# Patient Record
Sex: Female | Born: 1960
Health system: Southern US, Community
[De-identification: ages and names within clinical notes are randomized; demographics above are authoritative.]

---

## 2001-03-26 ENCOUNTER — Other Ambulatory Visit: Admission: RE | Admit: 2001-03-26 | Discharge: 2001-03-26 | Payer: Self-pay | Admitting: Obstetrics and Gynecology

## 2002-07-08 ENCOUNTER — Encounter: Payer: Self-pay | Admitting: Obstetrics and Gynecology

## 2002-07-08 ENCOUNTER — Ambulatory Visit (HOSPITAL_COMMUNITY): Admission: RE | Admit: 2002-07-08 | Discharge: 2002-07-08 | Payer: Self-pay | Admitting: Obstetrics and Gynecology

## 2008-02-27 ENCOUNTER — Other Ambulatory Visit: Admission: RE | Admit: 2008-02-27 | Discharge: 2008-02-27 | Payer: Self-pay | Admitting: Obstetrics and Gynecology

## 2008-03-09 ENCOUNTER — Ambulatory Visit (HOSPITAL_COMMUNITY): Admission: RE | Admit: 2008-03-09 | Discharge: 2008-03-09 | Payer: Self-pay | Admitting: Obstetrics & Gynecology

## 2008-09-10 ENCOUNTER — Ambulatory Visit (HOSPITAL_COMMUNITY): Admission: RE | Admit: 2008-09-10 | Discharge: 2008-09-10 | Payer: Self-pay | Admitting: Obstetrics and Gynecology

## 2009-03-11 ENCOUNTER — Ambulatory Visit (HOSPITAL_COMMUNITY): Admission: RE | Admit: 2009-03-11 | Discharge: 2009-03-11 | Payer: Self-pay | Admitting: Obstetrics and Gynecology

## 2009-03-25 ENCOUNTER — Other Ambulatory Visit: Admission: RE | Admit: 2009-03-25 | Discharge: 2009-03-25 | Payer: Self-pay | Admitting: Obstetrics and Gynecology

## 2010-05-13 ENCOUNTER — Other Ambulatory Visit: Admission: RE | Admit: 2010-05-13 | Discharge: 2010-05-13 | Payer: Self-pay | Admitting: Obstetrics and Gynecology

## 2010-05-25 ENCOUNTER — Ambulatory Visit (HOSPITAL_COMMUNITY): Admission: RE | Admit: 2010-05-25 | Discharge: 2010-05-25 | Payer: Self-pay | Admitting: Obstetrics & Gynecology

## 2011-05-18 ENCOUNTER — Other Ambulatory Visit: Payer: Self-pay | Admitting: Adult Health

## 2011-05-18 ENCOUNTER — Other Ambulatory Visit (HOSPITAL_COMMUNITY)
Admission: RE | Admit: 2011-05-18 | Discharge: 2011-05-18 | Disposition: A | Discharge status: Home / Self Care | Payer: Managed Care, Other (non HMO) | Source: Ambulatory Visit | Attending: Obstetrics and Gynecology | Admitting: Obstetrics and Gynecology

## 2011-05-18 DIAGNOSIS — Z01419 Encounter for gynecological examination (general) (routine) without abnormal findings: Secondary | ICD-10-CM | POA: Insufficient documentation

## 2011-05-25 ENCOUNTER — Other Ambulatory Visit: Payer: Self-pay | Admitting: Adult Health

## 2011-05-25 DIAGNOSIS — Z139 Encounter for screening, unspecified: Secondary | ICD-10-CM

## 2011-05-30 ENCOUNTER — Ambulatory Visit (HOSPITAL_COMMUNITY)
Admission: RE | Admit: 2011-05-30 | Discharge: 2011-05-30 | Disposition: A | Discharge status: Home / Self Care | Payer: Managed Care, Other (non HMO) | Source: Ambulatory Visit | Attending: Adult Health | Admitting: Adult Health

## 2011-05-30 DIAGNOSIS — Z1231 Encounter for screening mammogram for malignant neoplasm of breast: Secondary | ICD-10-CM | POA: Insufficient documentation

## 2011-05-30 DIAGNOSIS — Z139 Encounter for screening, unspecified: Secondary | ICD-10-CM

## 2012-06-20 ENCOUNTER — Other Ambulatory Visit (HOSPITAL_COMMUNITY)
Admission: RE | Admit: 2012-06-20 | Discharge: 2012-06-20 | Disposition: A | Discharge status: Home / Self Care | Payer: Managed Care, Other (non HMO) | Source: Ambulatory Visit | Attending: Obstetrics and Gynecology | Admitting: Obstetrics and Gynecology

## 2012-06-20 ENCOUNTER — Other Ambulatory Visit: Payer: Self-pay | Admitting: Adult Health

## 2012-06-20 DIAGNOSIS — Z1151 Encounter for screening for human papillomavirus (HPV): Secondary | ICD-10-CM | POA: Insufficient documentation

## 2012-06-20 DIAGNOSIS — Z01419 Encounter for gynecological examination (general) (routine) without abnormal findings: Secondary | ICD-10-CM | POA: Insufficient documentation

## 2013-01-16 ENCOUNTER — Other Ambulatory Visit: Payer: Self-pay | Admitting: Adult Health

## 2013-01-16 MED ORDER — HYDROCHLOROTHIAZIDE 25 MG PO TABS: 25.0000 mg | ORAL_TABLET | Freq: Every day | ORAL | Status: DC

## 2013-04-06 ENCOUNTER — Other Ambulatory Visit: Payer: Self-pay | Admitting: Adult Health

## 2013-12-11 ENCOUNTER — Other Ambulatory Visit: Payer: Self-pay | Admitting: Adult Health

## 2013-12-17 ENCOUNTER — Other Ambulatory Visit: Payer: Self-pay | Admitting: Adult Health

## 2014-02-16 ENCOUNTER — Other Ambulatory Visit: Payer: Self-pay | Admitting: Adult Health

## 2016-03-27 ENCOUNTER — Other Ambulatory Visit (HOSPITAL_COMMUNITY): Payer: Self-pay | Admitting: Nurse Practitioner

## 2016-03-27 DIAGNOSIS — F17290 Nicotine dependence, other tobacco product, uncomplicated: Secondary | ICD-10-CM

## 2016-03-27 DIAGNOSIS — Z1231 Encounter for screening mammogram for malignant neoplasm of breast: Secondary | ICD-10-CM

## 2016-04-10 ENCOUNTER — Encounter (HOSPITAL_COMMUNITY): Payer: Self-pay | Admitting: Radiology

## 2016-04-10 ENCOUNTER — Ambulatory Visit (HOSPITAL_COMMUNITY)
Admission: RE | Admit: 2016-04-10 | Discharge: 2016-04-10 | Disposition: A | Discharge status: Home / Self Care | Payer: Managed Care, Other (non HMO) | Source: Ambulatory Visit | Attending: Nurse Practitioner | Admitting: Nurse Practitioner

## 2016-04-10 DIAGNOSIS — F17290 Nicotine dependence, other tobacco product, uncomplicated: Secondary | ICD-10-CM | POA: Diagnosis present

## 2016-04-10 DIAGNOSIS — Z1231 Encounter for screening mammogram for malignant neoplasm of breast: Secondary | ICD-10-CM

## 2016-04-10 DIAGNOSIS — M85852 Other specified disorders of bone density and structure, left thigh: Secondary | ICD-10-CM | POA: Diagnosis not present

## 2018-01-09 ENCOUNTER — Other Ambulatory Visit (HOSPITAL_COMMUNITY): Payer: Self-pay | Admitting: Medical

## 2018-01-09 DIAGNOSIS — Z1231 Encounter for screening mammogram for malignant neoplasm of breast: Secondary | ICD-10-CM

## 2018-01-14 ENCOUNTER — Ambulatory Visit (HOSPITAL_COMMUNITY)
Admission: RE | Admit: 2018-01-14 | Discharge: 2018-01-14 | Disposition: A | Discharge status: Home / Self Care | Payer: Managed Care, Other (non HMO) | Source: Ambulatory Visit | Attending: Medical | Admitting: Medical

## 2018-01-14 ENCOUNTER — Encounter (HOSPITAL_COMMUNITY): Payer: Self-pay

## 2018-01-14 DIAGNOSIS — Z1231 Encounter for screening mammogram for malignant neoplasm of breast: Secondary | ICD-10-CM | POA: Insufficient documentation

## 2020-05-21 IMAGING — MG DIGITAL SCREENING BILATERAL MAMMOGRAM WITH CAD
4 series · 4 of 4 positions shown · non-contrast
Comparison: Previous exam(s).

CLINICAL DATA: Screening.

EXAM:
DIGITAL SCREENING BILATERAL MAMMOGRAM WITH CAD

[L MLO]
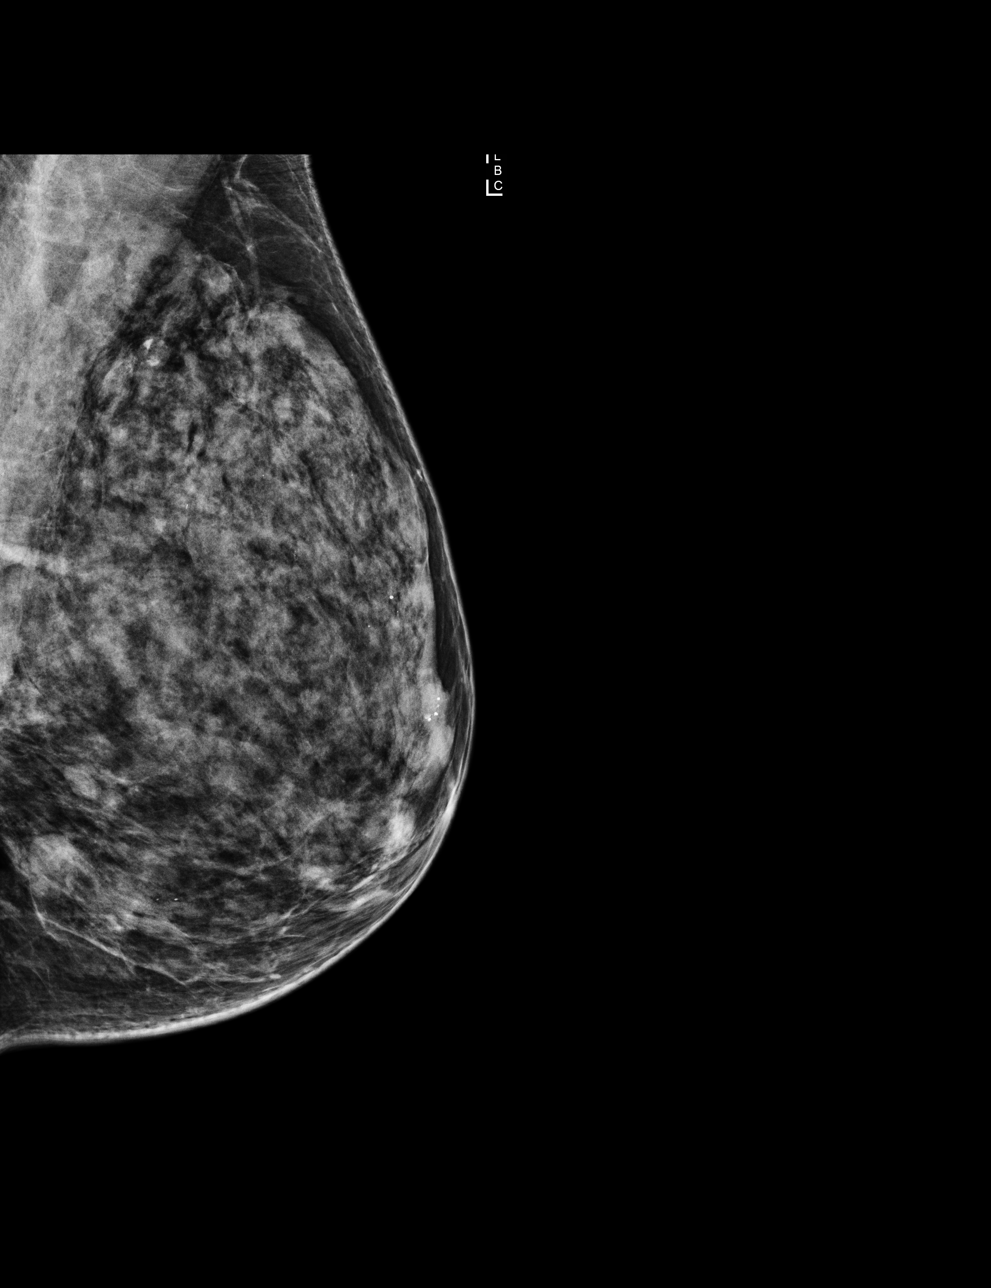

[R CC]
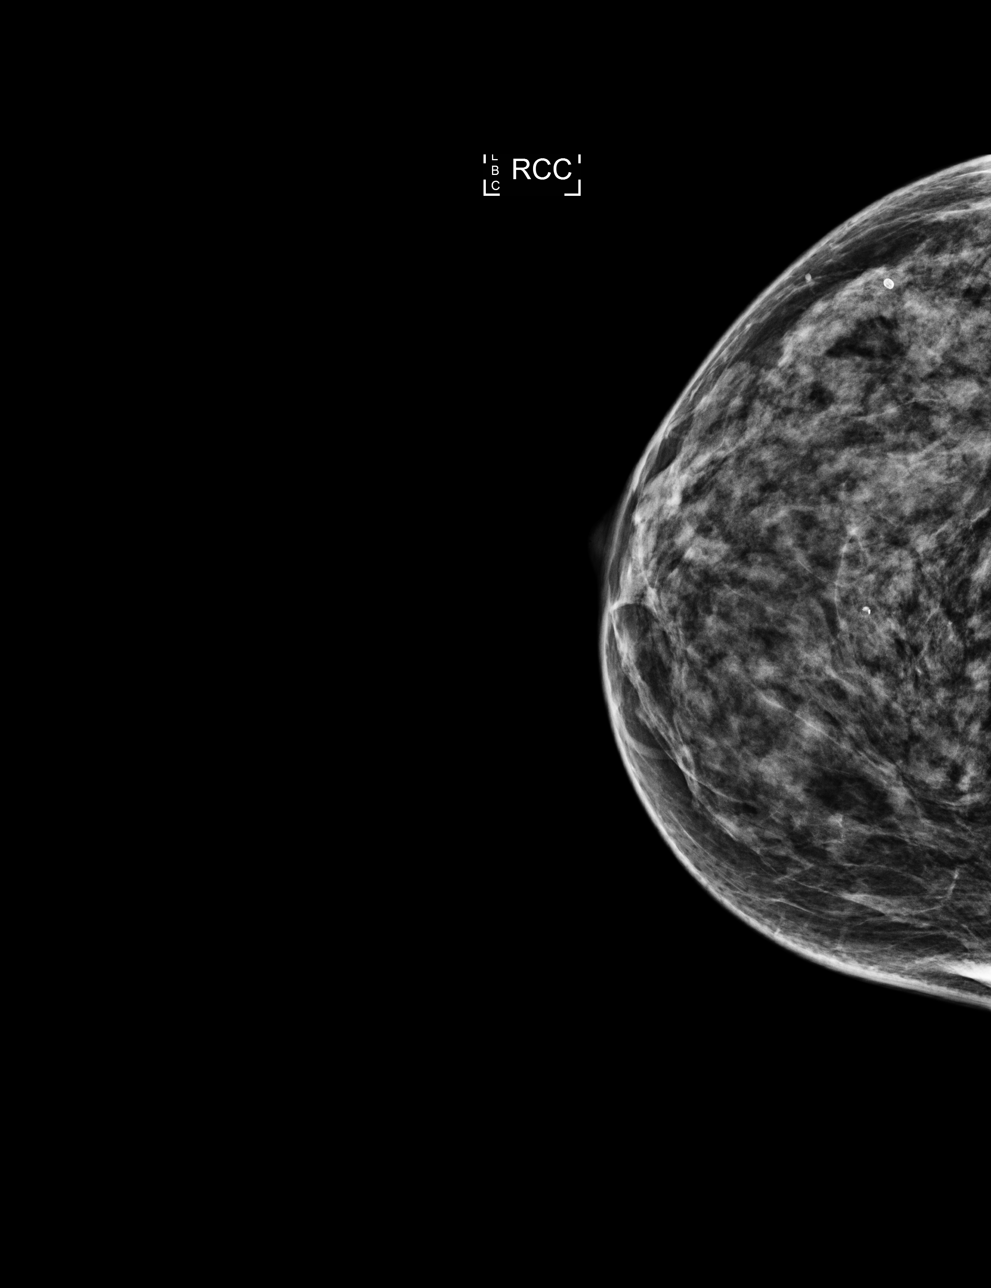

[R MLO]
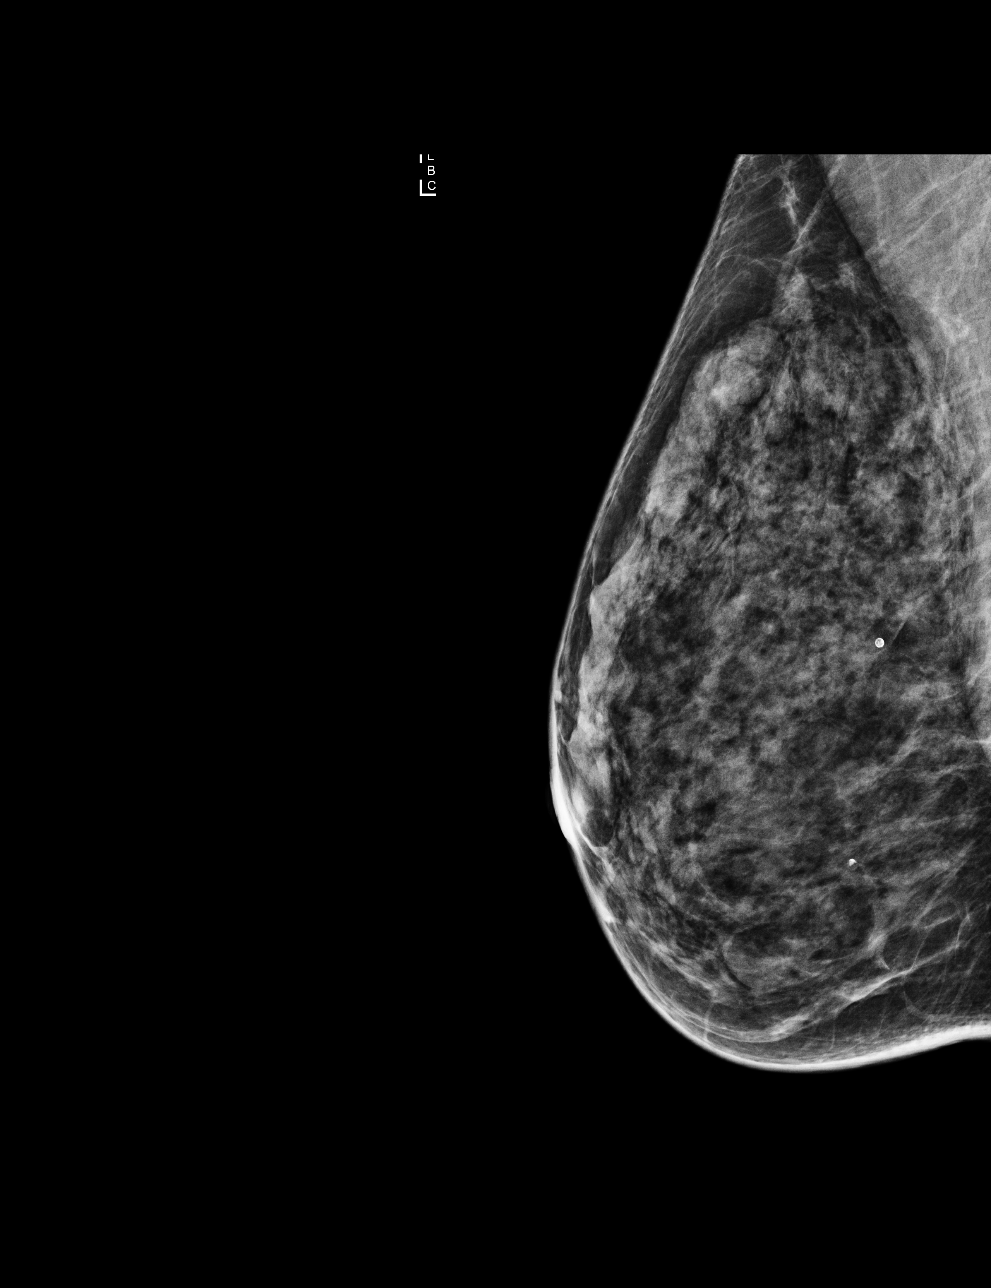

[L CC]
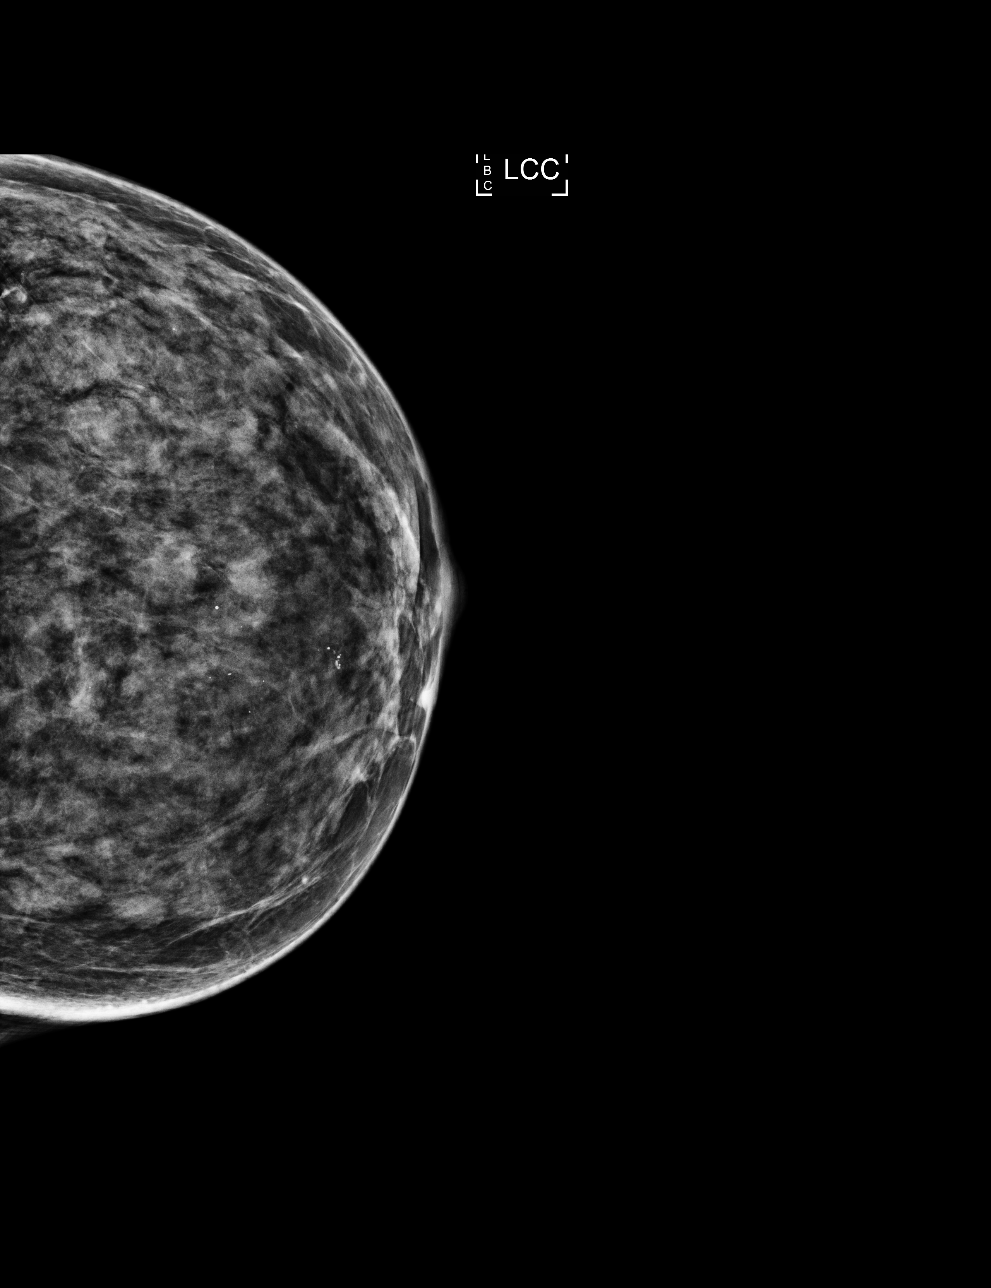

[4 of 4 positions shown; findings below may reference images not displayed]

ACR Breast Density Category d: The breast tissue is extremely dense,
which lowers the sensitivity of mammography.
FINDINGS: There are no findings suspicious for malignancy. Images were
processed with CAD.
IMPRESSION: No mammographic evidence of malignancy. A result letter of this
screening mammogram will be mailed directly to the patient.

RECOMMENDATION:
Screening mammogram in one year. (Code:[id number]-D-K0F)

BI-RADS CATEGORY  1: Negative.

## 2021-10-05 ENCOUNTER — Ambulatory Visit (INDEPENDENT_AMBULATORY_CARE_PROVIDER_SITE_OTHER): Payer: BC Managed Care – PPO | Admitting: Orthopaedic Surgery

## 2021-10-05 ENCOUNTER — Encounter: Payer: Self-pay | Admitting: Orthopaedic Surgery

## 2021-10-05 ENCOUNTER — Other Ambulatory Visit: Payer: Self-pay

## 2021-10-05 DIAGNOSIS — R2689 Other abnormalities of gait and mobility: Secondary | ICD-10-CM | POA: Diagnosis not present

## 2021-10-05 NOTE — Addendum Note (Signed)
Addended by: Wendi Maya on: 10/05/2021 12:47 PM ? ? Modules accepted: Orders ? ?

## 2021-10-05 NOTE — Progress Notes (Signed)
? ?Office Visit Note ?  ?Patient: Lynn Harper           ?Date of Birth: [birth year]           ?MRN: [id number] ?Visit Date: 10/05/2021 ?             ?Requested by: Neale Burly, MD ?[address] ?[place],  [place] [id number] ?PCP: Neale Burly, MD ? ? ?Assessment & Plan: ?Visit Diagnoses: Gait abnormality ? ?Plan: Lynn Harper is a pleasant 61 year old woman who presents with a 60-month history of antalgic gait in her right lower extremity.  She gets occasional tingling in her hands and in her feet she denies any injuries she denies any previous history of problems with this leg.  She has had no pain no effusion.  When she walks that she does not walks with an antalgic gait and is concerned about her balance.  Her knee exam is benign but she is definitely has a significant antalgic gait on the right with dragging of her foot and difficulty elevating her foot.  She does not have a foot drop today.  When further questioned she said that she has had some body twitches in the last several months.  She also reports a history of "blacking out a couple times.  Given this presentation her x-rays are benign she also has some skin lesions that are being followed which she calls are cancers.  We will refer her to a neurologist as we think this is some type of central neurologic problem. ? ?Follow-Up Instructions: No follow-ups on file.  ? ?Orders:  ?No orders of the defined types were placed in this encounter. ? ?No orders of the defined types were placed in this encounter. ? ? ? ? Procedures: ?No procedures performed ? ? ?Clinical Data: ?No additional findings. ? ? ?Subjective: ?Chief Complaint  ?Patient presents with  ? Right Knee - Pain  ?Patient presents today for her right leg. She said that since June of last year she has been having difficulty with walking due to her right knee. No pain at all. She said that she finds it difficult to flex when walking, but can do it just fine when standing still.  ? ? ? ?Review  of Systems  ?All other systems reviewed and are negative. ? ? ?Objective: ?Vital Signs: There were no vitals taken for this visit. ? ?Physical Exam ?Constitutional:   ?   Appearance: Normal appearance.  ?Pulmonary:  ?   Effort: Pulmonary effort is normal.  ?Skin: ?   General: Skin is warm and dry.  ?Neurological:  ?   Mental Status: She is alert.  ? ? ?Ortho Exam ?Examination of her right knee today she has no redness no cellulitis no warmth no effusion she has good stability with varus and valgus stressing good endpoint on Lachman testing no tenderness over the joint line.  Lower extremity testing she does have 4 out of 5 strength but has bilateral hyperreflexia with patella tendon reflexes.  No tenderness to palpation or abnormalities in her spine.  She has a significant antalgic gait on the right with snapping of her leg with inability to bend it through her gait cycle as she does on the left.  X-rays done of her knee show early degenerative changes but no significant arthritis no osseous tumors or fractures ?Specialty Comments:  ?No specialty comments available. ? ?Imaging: ?No results found. ? ? ?PMFS History: ?There are no problems to display for this patient. ? ?  No past medical history on file.  ?No family history on file.  ?No past surgical history on file. ?Social History  ? ?Occupational History  ? Not on file  ?Tobacco Use  ? Smoking status: Not on file  ? Smokeless tobacco: Not on file  ?Substance and Sexual Activity  ? Alcohol use: Not on file  ? Drug use: Not on file  ? Sexual activity: Not on file  ? ? ? ? ? ? ?

## 2021-11-16 ENCOUNTER — Other Ambulatory Visit: Payer: Self-pay | Admitting: Neurology

## 2021-11-16 ENCOUNTER — Other Ambulatory Visit (HOSPITAL_COMMUNITY): Payer: Self-pay | Admitting: Neurology

## 2021-11-16 DIAGNOSIS — M5002 Cervical disc disorder with myelopathy, mid-cervical region, unspecified level: Secondary | ICD-10-CM

## 2021-11-16 DIAGNOSIS — Z85828 Personal history of other malignant neoplasm of skin: Secondary | ICD-10-CM

## 2021-12-17 ENCOUNTER — Ambulatory Visit (HOSPITAL_COMMUNITY): Payer: BC Managed Care – PPO

## 2021-12-31 ENCOUNTER — Ambulatory Visit (HOSPITAL_COMMUNITY)
Admission: RE | Admit: 2021-12-31 | Discharge: 2021-12-31 | Disposition: A | Discharge status: Home / Self Care | Payer: BC Managed Care – PPO | Source: Ambulatory Visit | Attending: Neurology | Admitting: Neurology

## 2021-12-31 DIAGNOSIS — Z85828 Personal history of other malignant neoplasm of skin: Secondary | ICD-10-CM | POA: Insufficient documentation

## 2021-12-31 DIAGNOSIS — M5002 Cervical disc disorder with myelopathy, mid-cervical region, unspecified level: Secondary | ICD-10-CM | POA: Insufficient documentation

## 2021-12-31 MED ORDER — GADOBUTROL 1 MMOL/ML IV SOLN
5.0000 mL | Freq: Once | INTRAVENOUS | Status: AC | PRN
  Administered 2021-12-31: 5 mL via INTRAVENOUS

## 2023-06-19 ENCOUNTER — Encounter: Payer: Self-pay | Admitting: Orthopaedic Surgery
